# Patient Record
Sex: Male | Born: 1995
Health system: Southern US, Community
[De-identification: ages and names within clinical notes are randomized; demographics above are authoritative.]

## PROBLEM LIST (undated history)

## (undated) DIAGNOSIS — E669 Obesity, unspecified: Secondary | ICD-10-CM

## (undated) DIAGNOSIS — N62 Hypertrophy of breast: Secondary | ICD-10-CM

## (undated) HISTORY — DX: Obesity, unspecified: E66.9

## (undated) HISTORY — DX: Hypertrophy of breast: N62

---
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
## 2015-05-05 IMAGING — US US SCROTUM
1 series · 14 of 25 positions shown · non-contrast
Comparison: None.

CLINICAL DATA: Gynecomastia.  Evaluate for testicular mass.

EXAM:
SCROTAL ULTRASOUND
DOPPLER ULTRASOUND OF THE TESTICLES
TECHNIQUE: Complete ultrasound examination of the testicles, epididymis, and
other scrotal structures was performed. Color and spectral Doppler
ultrasound were also utilized to evaluate blood flow to the
testicles.

[Series 1: us scrotum · 0.05mm/px · 14 of 54 slices shown]
[im 1/54]
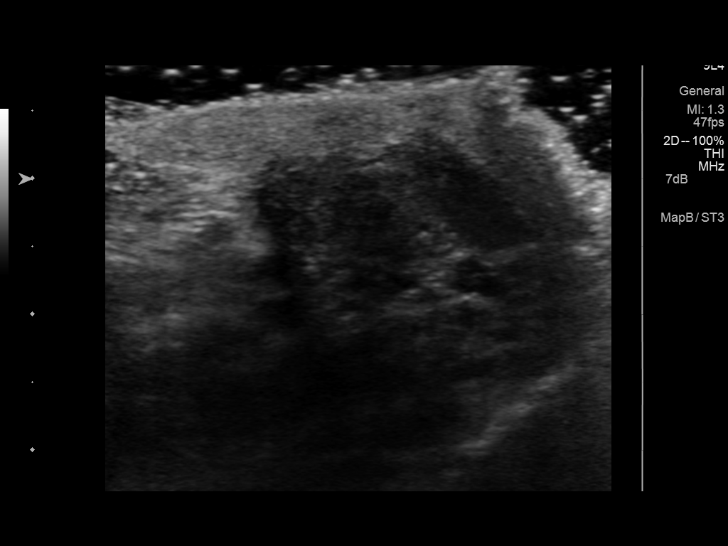
[im 5/54]
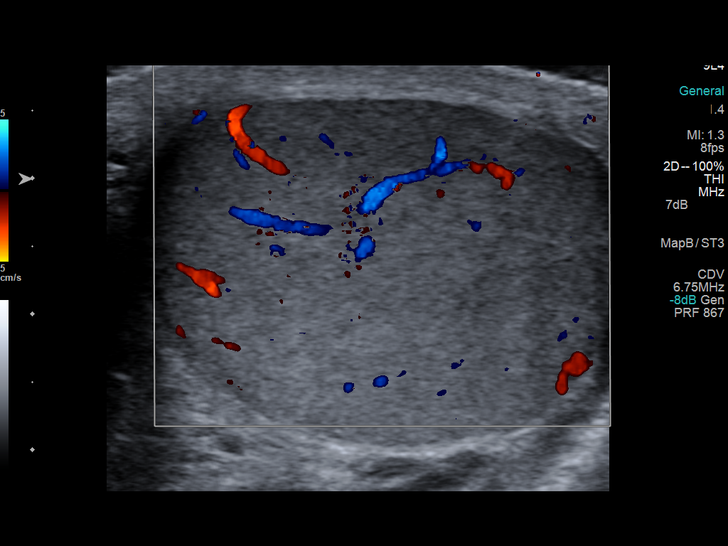
[im 9/54]
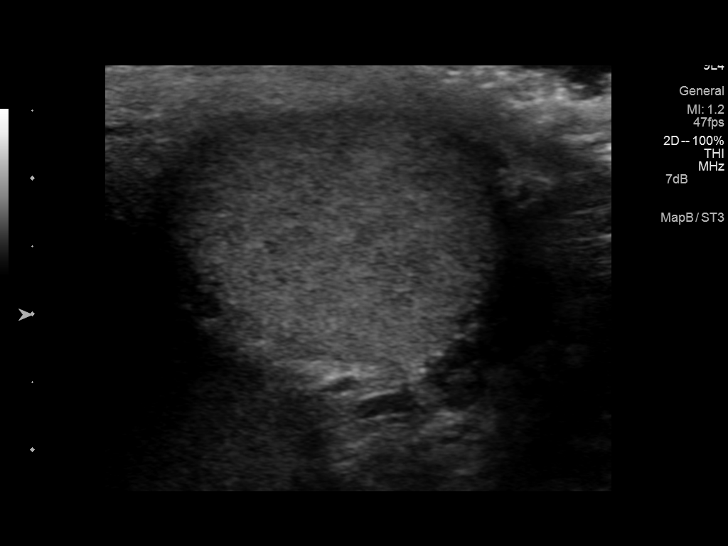
[im 14/54]
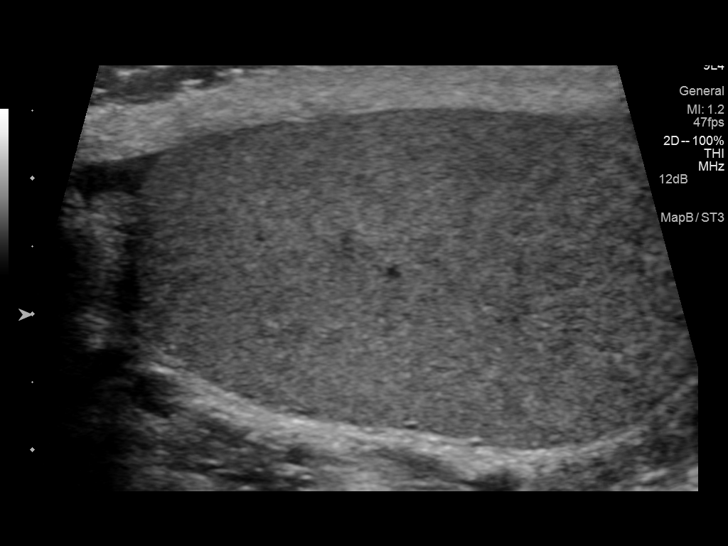
[im 18/54]
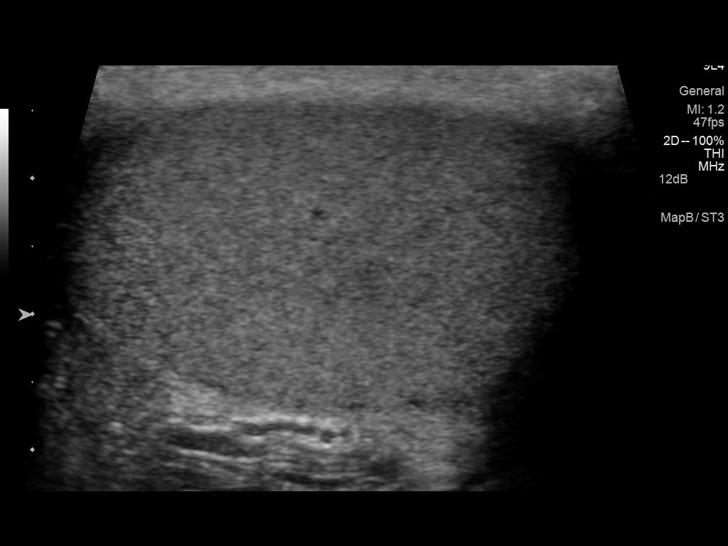
[im 20/54]
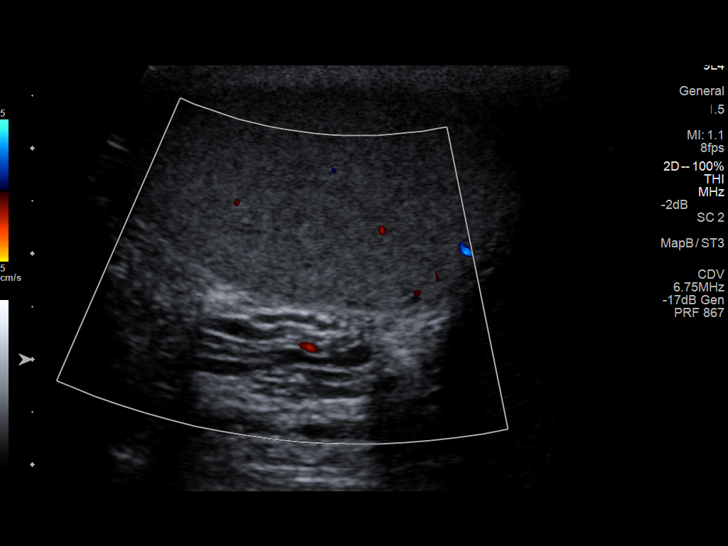
[im 25/54]
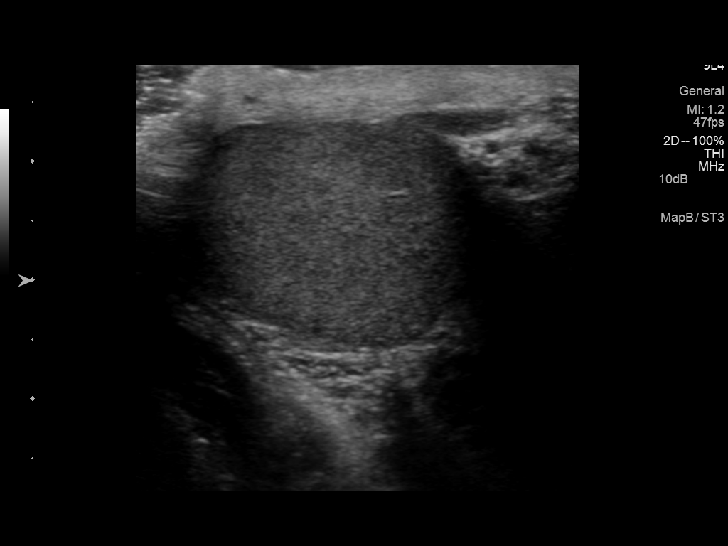
[im 29/54]
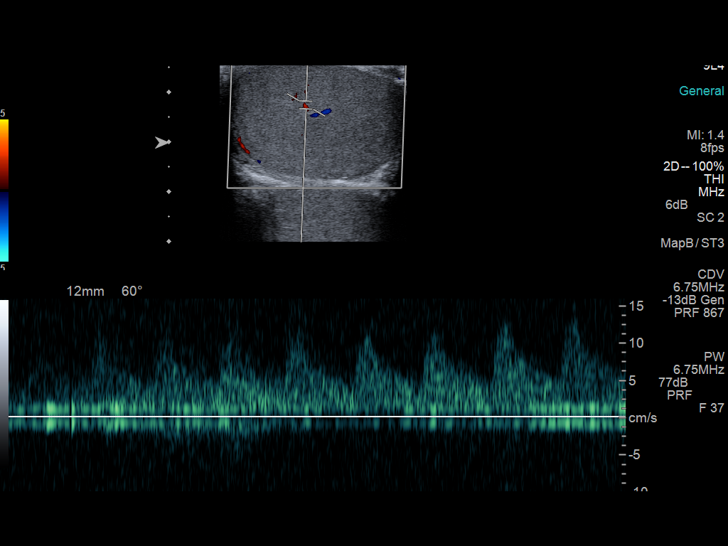
[im 34/54]
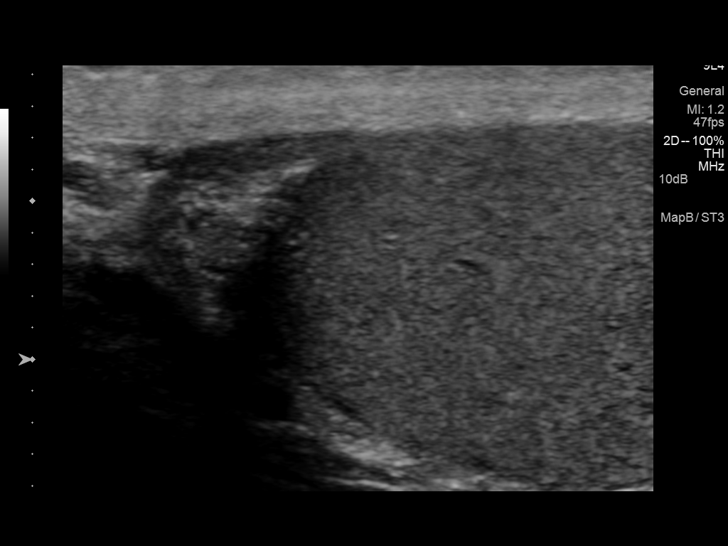
[im 36/54]
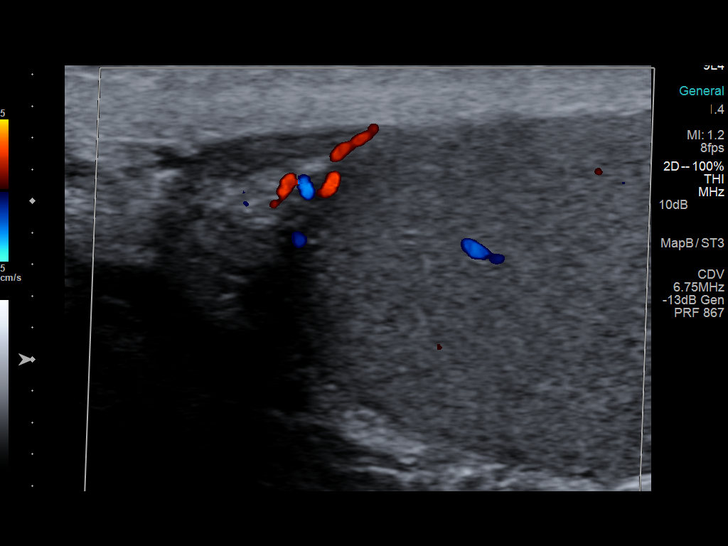
[im 40/54]
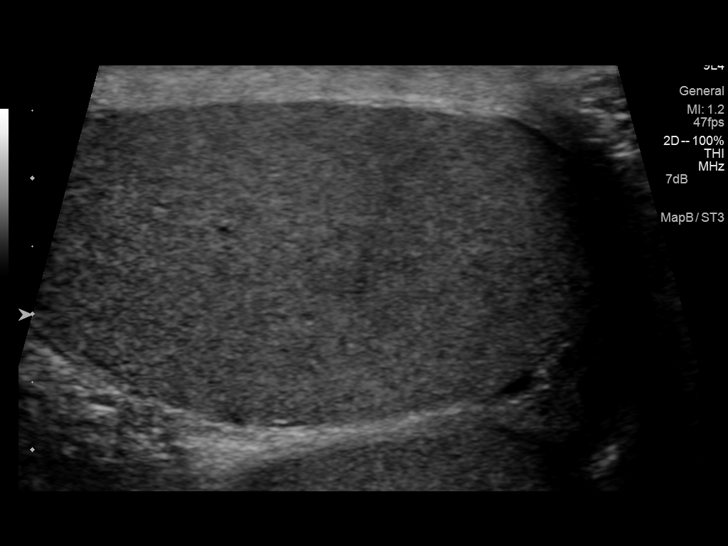
[im 45/54]
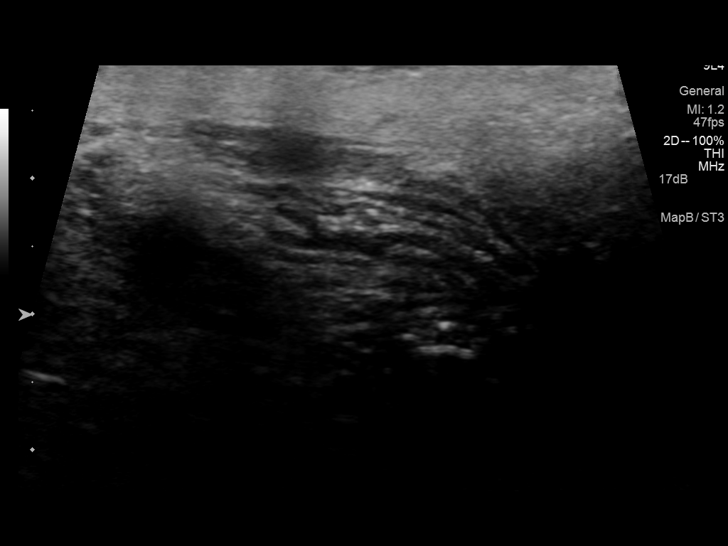
[im 49/54]
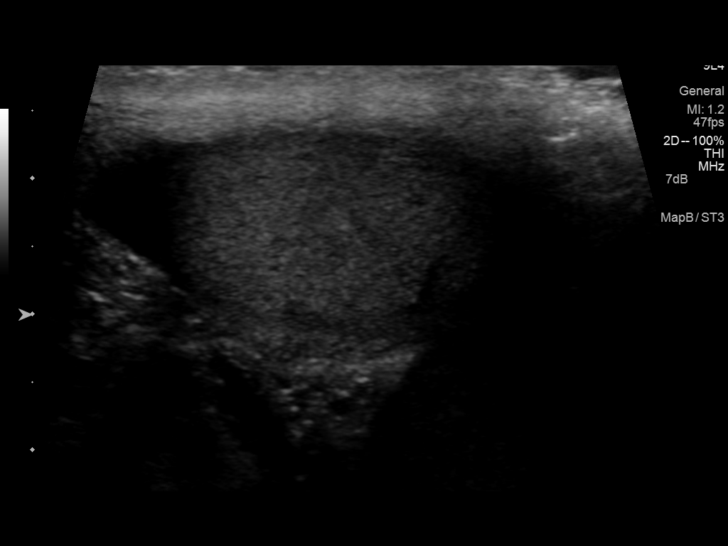
[im 54/54]
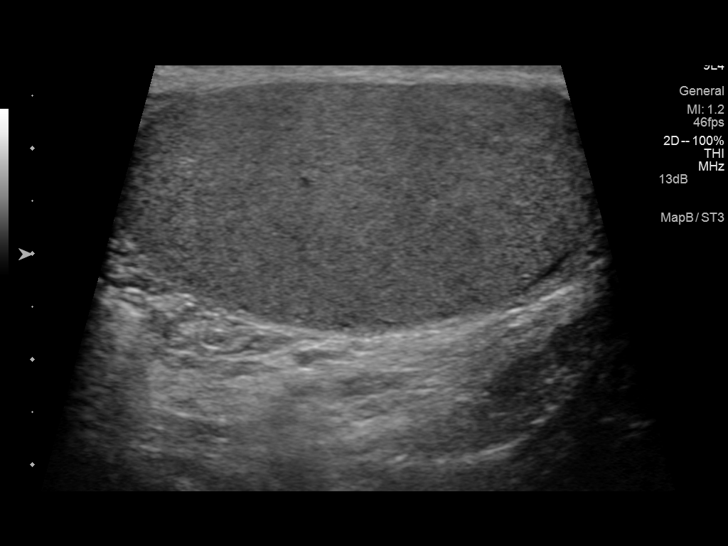

[14 of 25 positions shown; findings below may reference images not displayed]

FINDINGS: Right testicle

Measurements: 4.5 x 2.5 x 3.5 cm. No mass or microlithiasis
visualized.

Left testicle

Measurements: 4.4 x 2.4 x 3.6 cm. No mass or microlithiasis
visualized.

Right epididymis:  Normal in size and appearance.

Left epididymis:  Normal in size and appearance.

Hydrocele:  Small bilateral hydroceles.

Varicocele:  None visualized.

Pulsed Doppler interrogation of both testes demonstrates low
resistance arterial and venous waveforms bilaterally.
IMPRESSION: Small bilateral hydroceles. No focal abnormality otherwise noted.
Specifically there is no evidence of testicular mass.

## 2016-02-19 ENCOUNTER — Encounter: Payer: Self-pay | Admitting: Family Medicine

## 2016-02-19 ENCOUNTER — Ambulatory Visit (INDEPENDENT_AMBULATORY_CARE_PROVIDER_SITE_OTHER): Payer: 59 | Admitting: Family Medicine

## 2016-02-19 VITALS — BP 110/68 | HR 68 | Temp 98.6°F | Resp 14 | Ht 73.0 in | Wt 244.0 lb

## 2016-02-19 DIAGNOSIS — N62 Hypertrophy of breast: Secondary | ICD-10-CM | POA: Diagnosis not present

## 2016-02-19 DIAGNOSIS — Z7689 Persons encountering health services in other specified circumstances: Secondary | ICD-10-CM

## 2016-02-19 DIAGNOSIS — R21 Rash and other nonspecific skin eruption: Secondary | ICD-10-CM | POA: Diagnosis not present

## 2016-02-19 LAB — CBC WITH DIFFERENTIAL/PLATELET
Basophils Absolute: 0 cells/uL (ref 0–200)
Basophils Relative: 0 %
EOS PCT: 2 %
Eosinophils Absolute: 92 cells/uL (ref 15–500)
HCT: 46 % (ref 38.5–50.0)
HEMOGLOBIN: 15.5 g/dL (ref 13.0–17.0)
LYMPHS ABS: 1702 {cells}/uL (ref 850–3900)
Lymphocytes Relative: 37 %
MCH: 26.6 pg — ABNORMAL LOW (ref 27.0–33.0)
MCHC: 33.7 g/dL (ref 32.0–36.0)
MCV: 78.9 fL — ABNORMAL LOW (ref 80.0–100.0)
MPV: 8.8 fL (ref 7.5–12.5)
Monocytes Absolute: 368 cells/uL (ref 200–950)
Monocytes Relative: 8 %
NEUTROS ABS: 2438 {cells}/uL (ref 1500–7800)
NEUTROS PCT: 53 %
Platelets: 300 10*3/uL (ref 140–400)
RBC: 5.83 MIL/uL — AB (ref 4.20–5.80)
RDW: 13.6 % (ref 11.0–15.0)
WBC: 4.6 10*3/uL (ref 3.8–10.8)

## 2016-02-19 LAB — COMPLETE METABOLIC PANEL WITH GFR
ALBUMIN: 5.2 g/dL — AB (ref 3.6–5.1)
ALK PHOS: 41 U/L (ref 40–115)
ALT: 20 U/L (ref 9–46)
AST: 16 U/L (ref 10–40)
BUN: 11 mg/dL (ref 7–25)
CALCIUM: 9.6 mg/dL (ref 8.6–10.3)
CO2: 27 mmol/L (ref 20–31)
Chloride: 101 mmol/L (ref 98–110)
Creat: 0.7 mg/dL (ref 0.60–1.35)
Glucose, Bld: 82 mg/dL (ref 70–99)
POTASSIUM: 3.9 mmol/L (ref 3.5–5.3)
Sodium: 139 mmol/L (ref 135–146)
Total Bilirubin: 2.1 mg/dL — ABNORMAL HIGH (ref 0.2–1.2)
Total Protein: 7.7 g/dL (ref 6.1–8.1)

## 2016-02-19 LAB — LIPID PANEL
CHOLESTEROL: 145 mg/dL (ref <200)
HDL: 51 mg/dL (ref >40)
LDL Cholesterol: 85 mg/dL (ref <100)
TRIGLYCERIDES: 46 mg/dL (ref <150)
Total CHOL/HDL Ratio: 2.8 Ratio (ref <5.0)
VLDL: 9 mg/dL (ref <30)

## 2016-02-19 MED ORDER — CLOTRIMAZOLE-BETAMETHASONE 1-0.05 % EX CREA: 1.0000 "application " | TOPICAL_CREAM | Freq: Two times a day (BID) | CUTANEOUS | 0 refills | Status: DC

## 2016-02-19 NOTE — Progress Notes (Signed)
Subjective:    Patient ID: Jacob Gamble, male    DOB: 09/29/1995, 20 y.o.   MRN: [id number]  HPI Patient is a 20 year old African-American male here today to establish care. He does have one concern. There is a rash on his chest between his breast tissue. The rash is an erythematous plaque. The plaque consists of coalescent erythematous papules with a well-circumscribed border slightly serpiginous in nature. Does not have the classic appearance of tinea. However the patient denies any contact dermatitis or potential causes of contact dermatitis. The rash is very itchy. It is approximately 6 cm x 3 cm. There is no other rash anywhere else on the body. His been present for approximately a week. Past Medical History:  Diagnosis Date  . Obesity    No past surgical history on file. No current outpatient prescriptions on file prior to visit.   No current facility-administered medications on file prior to visit.    No Known Allergies Social History   Social History  . Marital status: Single    Spouse name: N/A  . Number of children: N/A  . Years of education: N/A   Occupational History  . Not on file.   Social History Main Topics  . Smoking status: Passive Smoke Exposure - Never Smoker  . Smokeless tobacco: Never Used  . Alcohol use Yes  . Drug use: No  . Sexual activity: Yes    Birth control/ protection: Condom   Other Topics Concern  . Not on file   Social History Narrative  . No narrative on file   Family History  Problem Relation Age of Onset  . Migraines Mother   . Hypertension Mother   . Migraines Sister   . Hypertension Sister   . Cancer Maternal Uncle     lung     Review of Systems  All other systems reviewed and are negative.      Objective:   Physical Exam  Constitutional: He is oriented to person, place, and time. He appears well-developed and well-nourished. No distress.  HENT:  Head: Normocephalic and atraumatic.  Right Ear: External ear normal.   Left Ear: External ear normal.  Nose: Nose normal.  Mouth/Throat: Oropharynx is clear and moist. No oropharyngeal exudate.  Eyes: Conjunctivae and EOM are normal. Pupils are equal, round, and reactive to light. Right eye exhibits no discharge. Left eye exhibits no discharge. No scleral icterus.  Neck: Normal range of motion. Neck supple. No JVD present. No tracheal deviation present. No thyromegaly present.  Cardiovascular: Normal rate, regular rhythm, normal heart sounds and intact distal pulses.  Exam reveals no gallop and no friction rub.   No murmur heard. Pulmonary/Chest: Effort normal and breath sounds normal. No stridor. No respiratory distress. He has no wheezes. He has no rales. He exhibits no tenderness.  Abdominal: Soft. Bowel sounds are normal. He exhibits no distension and no mass. There is no tenderness. There is no rebound and no guarding.  Musculoskeletal: Normal range of motion. He exhibits no edema, tenderness or deformity.  Lymphadenopathy:    He has no cervical adenopathy.  Neurological: He is alert and oriented to person, place, and time. He has normal reflexes. He displays normal reflexes. No cranial nerve deficit. He exhibits normal muscle tone. Coordination normal.  Skin: Rash noted. He is not diaphoretic.  Psychiatric: He has a normal mood and affect. His behavior is normal. Judgment and thought content normal.  Vitals reviewed.    Obese with gynecomastia  Assessment & Plan:  Encounter to establish care with new doctor - Plan: COMPLETE METABOLIC PANEL WITH GFR, CBC with Differential/Platelet, Lipid panel  Rash and nonspecific skin eruption  Gynecomastia  We had a scheduled regarding therapeutic lifestyle changes to address his obesity. I would like to see the patient closer to 190 pounds. I believe 20 pounds in the next year is a reasonable goal. I recommended avoidance of all soda and juice. I recommended that he drink water. Also recommended a low  carbohydrate diet was less than 1500 kcal per day. Also recommended 30 minutes of exercise 5 days a week. I will treat the rash is a yeast infection with Lotrisone cream twice daily for 1 week. Recheck in one week if no better. I'm treating this based partly on appearance and partly on location. The patient does have gynecomastia and this is almost in the intertriginous area. I'll also check a CBC, CMP, fasting lipid panel. Recommended a flu shot but the patient politely declined

## 2018-10-08 ENCOUNTER — Encounter: Payer: Self-pay | Admitting: Family Medicine

## 2018-10-08 ENCOUNTER — Other Ambulatory Visit: Payer: Self-pay

## 2018-10-08 ENCOUNTER — Ambulatory Visit (INDEPENDENT_AMBULATORY_CARE_PROVIDER_SITE_OTHER): Payer: 59 | Admitting: Family Medicine

## 2018-10-08 VITALS — BP 162/120 | HR 88 | Temp 98.6°F | Resp 16 | Ht 73.0 in | Wt 288.0 lb

## 2018-10-08 DIAGNOSIS — N62 Hypertrophy of breast: Secondary | ICD-10-CM

## 2018-10-08 DIAGNOSIS — Z0001 Encounter for general adult medical examination with abnormal findings: Secondary | ICD-10-CM

## 2018-10-08 DIAGNOSIS — Z0000 Encounter for general adult medical examination without abnormal findings: Secondary | ICD-10-CM

## 2018-10-08 DIAGNOSIS — R030 Elevated blood-pressure reading, without diagnosis of hypertension: Secondary | ICD-10-CM

## 2018-10-08 NOTE — Progress Notes (Signed)
Subjective:    Patient ID: Jacob Gamble, male    DOB: October 29, 1995, 23 y.o.   MRN: [id number]  HPI Patient presents today for a complete physical exam.  His blood pressure on intake was 162/120.  After sitting 10 minutes I rechecked his blood pressure and found it to be 142/100 which is still elevated.  He has not checked it recently.  He denies any chest pain shortness of breath or dyspnea on exertion.  He denies feeling anxious today during our encounter.  He has no medical concerns other than occasional headache.  He reports a headache in his left temple and left occiput.  It occurs once every 3 to 4 weeks.  It is pounding.  It is associated with photophobia.  He has a family history of migraines in his mother.  It will resolve with ibuprofen quickly.  He denies any nausea or vomiting or vision changes.  Otherwise he is doing well with no concerns Past Medical History:  Diagnosis Date  . Gynecomastia   . Obesity    No current outpatient medications on file prior to visit.   No current facility-administered medications on file prior to visit.     No Known Allergies Social History   Socioeconomic History  . Marital status: Single    Spouse name: Not on file  . Number of children: Not on file  . Years of education: Not on file  . Highest education level: Not on file  Occupational History  . Not on file  Social Needs  . Financial resource strain: Not on file  . Food insecurity    Worry: Not on file    Inability: Not on file  . Transportation needs    Medical: Not on file    Non-medical: Not on file  Tobacco Use  . Smoking status: Passive Smoke Exposure - Never Smoker  . Smokeless tobacco: Never Used  Substance and Sexual Activity  . Alcohol use: Yes  . Drug use: No  . Sexual activity: Yes    Birth control/protection: Condom  Lifestyle  . Physical activity    Days per week: Not on file    Minutes per session: Not on file  . Stress: Not on file  Relationships  . Social  Herbalist on phone: Not on file    Gets together: Not on file    Attends religious service: Not on file    Active member of club or organization: Not on file    Attends meetings of clubs or organizations: Not on file    Relationship status: Not on file  . Intimate partner violence    Fear of current or ex partner: Not on file    Emotionally abused: Not on file    Physically abused: Not on file    Forced sexual activity: Not on file  Other Topics Concern  . Not on file  Social History Narrative  . Not on file   Family History  Problem Relation Age of Onset  . Migraines Mother   . Hypertension Mother   . Migraines Sister   . Hypertension Sister   . Cancer Maternal Uncle        lung     Review of Systems  All other systems reviewed and are negative.      Objective:   Physical Exam  Constitutional: He is oriented to person, place, and time. He appears well-developed and well-nourished. No distress.  HENT:  Head: Normocephalic and atraumatic.  Right Ear: External ear normal.  Left Ear: External ear normal.  Nose: Nose normal.  Mouth/Throat: Oropharynx is clear and moist. No oropharyngeal exudate.  Eyes: Pupils are equal, round, and reactive to light. Conjunctivae and EOM are normal. Right eye exhibits no discharge. Left eye exhibits no discharge. No scleral icterus.  Neck: Normal range of motion. Neck supple. No JVD present. No tracheal deviation present. No thyromegaly present.  Cardiovascular: Normal rate, regular rhythm, normal heart sounds and intact distal pulses. Exam reveals no gallop and no friction rub.  No murmur heard. Pulmonary/Chest: Effort normal and breath sounds normal. No stridor. No respiratory distress. He has no wheezes. He has no rales. He exhibits no tenderness.  Abdominal: Soft. Bowel sounds are normal. He exhibits no distension and no mass. There is no abdominal tenderness. There is no rebound and no guarding.  Musculoskeletal: Normal  range of motion.        General: No tenderness, deformity or edema.  Lymphadenopathy:    He has no cervical adenopathy.  Neurological: He is alert and oriented to person, place, and time. He has normal reflexes. No cranial nerve deficit. He exhibits normal muscle tone. Coordination normal.  Skin: He is not diaphoretic.  Psychiatric: He has a normal mood and affect. His behavior is normal. Judgment and thought content normal.  Vitals reviewed.    Obese with gynecomastia     Assessment & Plan:  The primary encounter diagnosis was Elevated blood pressure reading. Diagnoses of General medical exam and Gynecomastia were also pertinent to this visit. I am concerned by his elevated blood pressure.  I will asked the patient check his blood pressure twice a day for the next week and notify me of the values.  If consistently elevated I am concerned about secondary hypertension possibly due to obstructive sleep apnea given his obesity.  I would also check fasting lab work including a CBC, CMP, fasting lipid panel.  Recommended diet exercise and weight loss.  I do think patients develop migraines and is having occasional migraines that respond quickly to ibuprofen.  As long as he is only get the headaches once every month and they respond quickly to ibuprofen I would not focus on preventative strategies.  Await the results of the lab work and follow-up on blood pressures at the end of the week as we discussed.

## 2018-10-09 LAB — CBC WITH DIFFERENTIAL/PLATELET
Absolute Monocytes: 456 cells/uL (ref 200–950)
Basophils Absolute: 32 cells/uL (ref 0–200)
Basophils Relative: 0.6 %
Eosinophils Absolute: 159 cells/uL (ref 15–500)
Eosinophils Relative: 3 %
HCT: 46.2 % (ref 38.5–50.0)
Hemoglobin: 15.1 g/dL (ref 13.2–17.1)
Lymphs Abs: 2120 cells/uL (ref 850–3900)
MCH: 26.2 pg — ABNORMAL LOW (ref 27.0–33.0)
MCHC: 32.7 g/dL (ref 32.0–36.0)
MCV: 80.1 fL (ref 80.0–100.0)
MPV: 9.7 fL (ref 7.5–12.5)
Monocytes Relative: 8.6 %
Neutro Abs: 2533 cells/uL (ref 1500–7800)
Neutrophils Relative %: 47.8 %
Platelets: 354 10*3/uL (ref 140–400)
RBC: 5.77 10*6/uL (ref 4.20–5.80)
RDW: 12.9 % (ref 11.0–15.0)
Total Lymphocyte: 40 %
WBC: 5.3 10*3/uL (ref 3.8–10.8)

## 2018-10-09 LAB — COMPLETE METABOLIC PANEL WITH GFR
AG Ratio: 1.7 (calc) (ref 1.0–2.5)
ALT: 45 U/L (ref 9–46)
AST: 24 U/L (ref 10–40)
Albumin: 4.6 g/dL (ref 3.6–5.1)
Alkaline phosphatase (APISO): 49 U/L (ref 36–130)
BUN: 11 mg/dL (ref 7–25)
CO2: 25 mmol/L (ref 20–32)
Calcium: 9.5 mg/dL (ref 8.6–10.3)
Chloride: 104 mmol/L (ref 98–110)
Creat: 0.82 mg/dL (ref 0.60–1.35)
GFR, Est African American: 143 mL/min/{1.73_m2} (ref >=60)
GFR, Est Non African American: 124 mL/min/{1.73_m2} (ref >=60)
Globulin: 2.7 g/dL (calc) (ref 1.9–3.7)
Glucose, Bld: 95 mg/dL (ref 65–99)
Potassium: 4.1 mmol/L (ref 3.5–5.3)
Sodium: 139 mmol/L (ref 135–146)
Total Bilirubin: 1.3 mg/dL — ABNORMAL HIGH (ref 0.2–1.2)
Total Protein: 7.3 g/dL (ref 6.1–8.1)

## 2018-10-09 LAB — LIPID PANEL
Cholesterol: 159 mg/dL (ref <200)
HDL: 46 mg/dL (ref >=40)
LDL Cholesterol (Calc): 99 mg/dL (calc)
Non-HDL Cholesterol (Calc): 113 mg/dL (calc) (ref <130)
Total CHOL/HDL Ratio: 3.5 (calc) (ref <5.0)
Triglycerides: 48 mg/dL (ref <150)

## 2018-11-20 ENCOUNTER — Telehealth: Payer: Self-pay | Admitting: Family Medicine

## 2018-11-20 NOTE — Telephone Encounter (Signed)
FYI Patient left voicemail  to notify us that he has tested positive for COVID and is currently isolated. Left voicemail for patient to inquire if he is having any symptoms

## 2018-11-27 NOTE — Telephone Encounter (Signed)
Pt left vm to let us know that he did test positive for COVID 19

## 2018-11-28 ENCOUNTER — Other Ambulatory Visit: Payer: Self-pay

## 2018-11-28 DIAGNOSIS — U071 COVID-19: Secondary | ICD-10-CM

## 2018-11-29 LAB — NOVEL CORONAVIRUS, NAA: SARS-CoV-2, NAA: NOT DETECTED

## 2018-11-30 ENCOUNTER — Telehealth: Payer: Self-pay | Admitting: Hematology

## 2018-11-30 NOTE — Telephone Encounter (Signed)
Pt is aware covid 19 test is negative °

## 2019-04-25 ENCOUNTER — Ambulatory Visit: Payer: Self-pay | Attending: Family

## 2019-04-25 DIAGNOSIS — Z23 Encounter for immunization: Secondary | ICD-10-CM | POA: Insufficient documentation

## 2019-04-25 NOTE — Progress Notes (Signed)
   Covid-19 Vaccination Clinic  Name:  Jacob Gamble    MRN: [id number] DOB: [birth year]  04/25/2019  [name] was observed post Covid-19 immunization for 15 minutes without incidence. He was provided with Vaccine Information Sheet and instruction to access the V-Safe system.   [name] was instructed to call 911 with any severe reactions post vaccine: Marland Kitchen Difficulty breathing  . Swelling of your face and throat  . A fast heartbeat  . A bad rash all over your body  . Dizziness and weakness    Immunizations Administered    Name Date Dose VIS Date Route   Moderna COVID-19 Vaccine 04/25/2019 10:45 AM 0.5 mL 02/12/2019 Intramuscular   Manufacturer: Moderna   Lot: [id number]   NDC: 40684-033-53

## 2019-05-28 ENCOUNTER — Ambulatory Visit: Payer: Self-pay | Attending: Internal Medicine

## 2020-01-21 ENCOUNTER — Ambulatory Visit: Payer: Self-pay | Admitting: Family Medicine

## 2020-01-21 ENCOUNTER — Other Ambulatory Visit: Payer: Self-pay

## 2020-01-21 VITALS — BP 130/96 | HR 75 | Temp 98.2°F | Ht 71.0 in | Wt 292.0 lb

## 2020-01-21 DIAGNOSIS — N62 Hypertrophy of breast: Secondary | ICD-10-CM

## 2020-01-21 DIAGNOSIS — R030 Elevated blood-pressure reading, without diagnosis of hypertension: Secondary | ICD-10-CM

## 2020-01-21 DIAGNOSIS — B360 Pityriasis versicolor: Secondary | ICD-10-CM

## 2020-01-21 DIAGNOSIS — Z0000 Encounter for general adult medical examination without abnormal findings: Secondary | ICD-10-CM

## 2020-01-21 DIAGNOSIS — Z0001 Encounter for general adult medical examination with abnormal findings: Secondary | ICD-10-CM

## 2020-01-21 LAB — CBC WITH DIFFERENTIAL/PLATELET: Basophils Relative: 0.4 %

## 2020-01-21 MED ORDER — KETOCONAZOLE 2 % EX SHAM: 1.0000 "application " | MEDICATED_SHAMPOO | CUTANEOUS | 11 refills | Status: AC

## 2020-01-21 NOTE — Progress Notes (Signed)
Subjective:    Patient ID: Jacob Gamble, male    DOB: May 06, 1995, 24 y.o.   MRN: [id number]  HPI Patient is a very pleasant 24 year old male here today for complete physical exam.  Last year at his physical exam his blood pressure was high.  He is again high this year.  He denies any chest pain shortness of breath or dyspnea on exertion.  He admits to eating a high salt-containing diet and also a diet high in fast food, pork, and red meat.  He is not getting regular exercise.  His BMI is elevated at 40.  Otherwise he is doing well.  He declines his flu shot today.  He has had 1 out of 2 Covid vaccines. Past Medical History:  Diagnosis Date  . Gynecomastia   . Obesity    No current outpatient medications on file prior to visit.   No current facility-administered medications on file prior to visit.    No Known Allergies Social History   Socioeconomic History  . Marital status: Single    Spouse name: Not on file  . Number of children: Not on file  . Years of education: Not on file  . Highest education level: Not on file  Occupational History  . Not on file  Tobacco Use  . Smoking status: Passive Smoke Exposure - Never Smoker  . Smokeless tobacco: Never Used  Substance and Sexual Activity  . Alcohol use: Yes  . Drug use: No  . Sexual activity: Yes    Birth control/protection: Condom  Other Topics Concern  . Not on file  Social History Narrative  . Not on file   Social Determinants of Health   Financial Resource Strain:   . Difficulty of Paying Living Expenses: Not on file  Food Insecurity:   . Worried About Programme researcher, broadcasting/film/video in the Last Year: Not on file  . Ran Out of Food in the Last Year: Not on file  Transportation Needs:   . Lack of Transportation (Medical): Not on file  . Lack of Transportation (Non-Medical): Not on file  Physical Activity:   . Days of Exercise per Week: Not on file  . Minutes of Exercise per Session: Not on file  Stress:   . Feeling of  Stress : Not on file  Social Connections:   . Frequency of Communication with Friends and Family: Not on file  . Frequency of Social Gatherings with Friends and Family: Not on file  . Attends Religious Services: Not on file  . Active Member of Clubs or Organizations: Not on file  . Attends Banker Meetings: Not on file  . Marital Status: Not on file  Intimate Partner Violence:   . Fear of Current or Ex-Partner: Not on file  . Emotionally Abused: Not on file  . Physically Abused: Not on file  . Sexually Abused: Not on file   Family History  Problem Relation Age of Onset  . Migraines Mother   . Hypertension Mother   . Migraines Sister   . Hypertension Sister   . Cancer Maternal Uncle        lung     Review of Systems  All other systems reviewed and are negative.      Objective:   Physical Exam Vitals reviewed.  Constitutional:      General: He is not in acute distress.    Appearance: He is well-developed. He is not diaphoretic.  HENT:     Head: Normocephalic  and atraumatic.     Right Ear: External ear normal.     Left Ear: External ear normal.     Nose: Nose normal.     Mouth/Throat:     Pharynx: No oropharyngeal exudate.  Eyes:     General: No scleral icterus.       Right eye: No discharge.        Left eye: No discharge.     Conjunctiva/sclera: Conjunctivae normal.     Pupils: Pupils are equal, round, and reactive to light.  Neck:     Thyroid: No thyromegaly.     Vascular: No JVD.     Trachea: No tracheal deviation.  Cardiovascular:     Rate and Rhythm: Normal rate and regular rhythm.     Heart sounds: Normal heart sounds. No murmur heard.  No friction rub. No gallop.   Pulmonary:     Effort: Pulmonary effort is normal. No respiratory distress.     Breath sounds: Normal breath sounds. No stridor. No wheezing or rales.  Chest:     Chest wall: No tenderness.  Abdominal:     General: Bowel sounds are normal. There is no distension.      Palpations: Abdomen is soft. There is no mass.     Tenderness: There is no abdominal tenderness. There is no guarding or rebound.  Musculoskeletal:        General: No tenderness or deformity. Normal range of motion.     Cervical back: Normal range of motion and neck supple.  Lymphadenopathy:     Cervical: No cervical adenopathy.  Skin:    Findings: Rash present. Rash is macular.       Neurological:     Mental Status: He is alert and oriented to person, place, and time.     Cranial Nerves: No cranial nerve deficit.     Motor: No abnormal muscle tone.     Coordination: Coordination normal.     Deep Tendon Reflexes: Reflexes are normal and symmetric.  Psychiatric:        Behavior: Behavior normal.        Thought Content: Thought content normal.        Judgment: Judgment normal.      Obese with gynecomastia     Assessment & Plan:  Elevated blood pressure reading - Plan: CBC with Differential/Platelet, COMPLETE METABOLIC PANEL WITH GFR, Lipid panel  General medical exam  Gynecomastia  Tinea versicolor   I am again concerned by his blood pressure.  I explained to the patient that elevated blood pressure over time can cause kidney problems.  It can also cause heart issues.  Patient would like to check his blood pressure at home over the next few days and then email me the values.  If greater than 140/90, I would recommend starting him on an angiotensin receptor blocker.  Also recommended 30 minutes of aerobic exercise 5 days a week and aggressive weight loss.  We also discussed a low saturated fat low sodium diet.  I will check, a CBC, CMP, and a fasting lipid panel.  Offered the patient a flu shot but he declined.  I recommend he get his second Covid vaccine.  He has tinea versicolor on his chest abdomen and back.  We will treat this with ketoconazole 2% shampoo applied 2-3 times a week and rinse off after 10 minutes.

## 2020-01-22 LAB — CBC WITH DIFFERENTIAL/PLATELET
Absolute Monocytes: 442 cells/uL (ref 200–950)
Basophils Absolute: 21 cells/uL (ref 0–200)
Eosinophils Absolute: 120 cells/uL (ref 15–500)
Eosinophils Relative: 2.3 %
HCT: 45.6 % (ref 38.5–50.0)
Hemoglobin: 14.8 g/dL (ref 13.2–17.1)
Lymphs Abs: 1758 cells/uL (ref 850–3900)
MCH: 26.4 pg — ABNORMAL LOW (ref 27.0–33.0)
MCHC: 32.5 g/dL (ref 32.0–36.0)
MCV: 81.3 fL (ref 80.0–100.0)
MPV: 9.2 fL (ref 7.5–12.5)
Monocytes Relative: 8.5 %
Neutro Abs: 2860 cells/uL (ref 1500–7800)
Neutrophils Relative %: 55 %
Platelets: 350 10*3/uL (ref 140–400)
RBC: 5.61 10*6/uL (ref 4.20–5.80)
RDW: 12.5 % (ref 11.0–15.0)
Total Lymphocyte: 33.8 %
WBC: 5.2 10*3/uL (ref 3.8–10.8)

## 2020-01-22 LAB — COMPLETE METABOLIC PANEL WITH GFR
AG Ratio: 1.8 (calc) (ref 1.0–2.5)
ALT: 38 U/L (ref 9–46)
AST: 21 U/L (ref 10–40)
Albumin: 4.6 g/dL (ref 3.6–5.1)
Alkaline phosphatase (APISO): 56 U/L (ref 36–130)
BUN: 13 mg/dL (ref 7–25)
CO2: 26 mmol/L (ref 20–32)
Calcium: 9.6 mg/dL (ref 8.6–10.3)
Chloride: 104 mmol/L (ref 98–110)
Creat: 0.76 mg/dL (ref 0.60–1.35)
GFR, Est African American: 147 mL/min/{1.73_m2} (ref >=60)
GFR, Est Non African American: 127 mL/min/{1.73_m2} (ref >=60)
Globulin: 2.6 g/dL (calc) (ref 1.9–3.7)
Glucose, Bld: 99 mg/dL (ref 65–99)
Potassium: 4 mmol/L (ref 3.5–5.3)
Sodium: 139 mmol/L (ref 135–146)
Total Bilirubin: 1.3 mg/dL — ABNORMAL HIGH (ref 0.2–1.2)
Total Protein: 7.2 g/dL (ref 6.1–8.1)

## 2020-01-22 LAB — LIPID PANEL
Cholesterol: 163 mg/dL (ref <200)
HDL: 51 mg/dL (ref >=40)
LDL Cholesterol (Calc): 95 mg/dL (calc)
Non-HDL Cholesterol (Calc): 112 mg/dL (calc) (ref <130)
Total CHOL/HDL Ratio: 3.2 (calc) (ref <5.0)
Triglycerides: 84 mg/dL (ref <150)

## 2021-05-29 ENCOUNTER — Ambulatory Visit
Admission: EM | Admit: 2021-05-29 | Discharge: 2021-05-29 | Disposition: A | Discharge status: Home / Self Care | Payer: Self-pay | Attending: Physician Assistant | Admitting: Physician Assistant

## 2021-05-29 ENCOUNTER — Other Ambulatory Visit: Payer: Self-pay

## 2021-05-29 DIAGNOSIS — Z23 Encounter for immunization: Secondary | ICD-10-CM

## 2021-05-29 DIAGNOSIS — S61210A Laceration without foreign body of right index finger without damage to nail, initial encounter: Secondary | ICD-10-CM

## 2021-05-29 MED ORDER — TETANUS-DIPHTH-ACELL PERTUSSIS 5-2.5-18.5 LF-MCG/0.5 IM SUSY
0.5000 mL | PREFILLED_SYRINGE | Freq: Once | INTRAMUSCULAR | Status: AC
  Administered 2021-05-29: 0.5 mL via INTRAMUSCULAR

## 2021-05-29 NOTE — ED Provider Notes (Signed)
?[facility] ? ? ? ?CSN: [id number] ?Arrival date & time: 05/29/21  1037 ? ? ?  ? ?History   ?Chief Complaint ?Chief Complaint  ?Patient presents with  ? Laceration  ? ? ?HPI ?Jacob Gamble is a 26 y.o. male.  ? ?Patient here today for evaluation of laceration to his right index finger that occurred about an hour or so ago.  He reports that he was helping to move furniture in a nail from a chair cut his finger.  He does not have any active bleeding at this time.  He is unsure when his last tetanus vaccine was. ? ?The history is provided by the patient.  ?Laceration ?Associated symptoms: no fever   ? ?Past Medical History:  ?Diagnosis Date  ? Gynecomastia   ? Obesity   ? ? ?Patient Active Problem List  ? Diagnosis Date Noted  ? Gynecomastia   ? Gynecomastia, male 06/04/2013  ? Tinea versicolor 06/04/2013  ? Well child check 06/04/2013  ? Frequent headaches 06/04/2013  ? ANKLE SPRAIN 11/11/2009  ? ? ?History reviewed. No pertinent surgical history. ? ? ? ? ?Home Medications   ? ?Prior to Admission medications   ?Medication Sig Start Date End Date Taking? Authorizing Provider  ?ketoconazole (NIZORAL) 2 % shampoo Apply 1 application topically 2 (two) times a week. 01/23/20   Donita Brooks, MD  ? ? ?Family History ?Family History  ?Problem Relation Age of Onset  ? Migraines Mother   ? Hypertension Mother   ? Migraines Sister   ? Hypertension Sister   ? Cancer Maternal Uncle   ?     lung  ? ? ?Social History ?Social History  ? ?Tobacco Use  ? Smoking status: Passive Smoke Exposure - Never Smoker  ? Smokeless tobacco: Never  ?Substance Use Topics  ? Alcohol use: Yes  ? Drug use: No  ? ? ? ?Allergies   ?Patient has no known allergies. ? ? ?Review of Systems ?Review of Systems  ?Constitutional:  Negative for chills and fever.  ?Eyes:  Negative for discharge and redness.  ?Skin:  Positive for wound. Negative for color change.  ?Neurological:  Negative for numbness.  ? ? ?Physical Exam ?Triage Vital Signs ?ED  Triage Vitals  ?Enc Vitals Group  ?   BP 05/29/21 1138 138/89  ?   Pulse Rate 05/29/21 1138 74  ?   Resp 05/29/21 1138 18  ?   Temp 05/29/21 1138 98.4 ?F (36.9 ?C)  ?   Temp Source 05/29/21 1138 Oral  ?   SpO2 05/29/21 1138 98 %  ?   Weight --   ?   Height --   ?   Head Circumference --   ?   Peak Flow --   ?   Pain Score 05/29/21 1136 0  ?   Pain Loc --   ?   Pain Edu? --   ?   Excl. in GC? --   ? ?No data found. ? ?Updated Vital Signs ?BP 138/89 (BP Location: Left Arm)   Pulse 74   Temp 98.4 ?F (36.9 ?C) (Oral)   Resp 18   SpO2 98%  ?   ? ?Physical Exam ?Vitals and nursing note reviewed.  ?Constitutional:   ?   General: He is not in acute distress. ?   Appearance: Normal appearance. He is not ill-appearing.  ?HENT:  ?   Head: Normocephalic and atraumatic.  ?Eyes:  ?   Conjunctiva/sclera: Conjunctivae normal.  ?Cardiovascular:  ?  Rate and Rhythm: Normal rate.  ?Pulmonary:  ?   Effort: Pulmonary effort is normal.  ?Skin: ?   Comments: Approx 1 inch relatively superficial laceration to palmar surface of right index finger between MCP and PIP, no active bleeding or drainage, wound is clean, no FB appreciated  ?Neurological:  ?   Mental Status: He is alert.  ?Psychiatric:     ?   Mood and Affect: Mood normal.     ?   Behavior: Behavior normal.     ?   Thought Content: Thought content normal.  ? ? ? ?UC Treatments / Results  ?Labs ?(all labs ordered are listed, but only abnormal results are displayed) ?Labs Reviewed - No data to display ? ?EKG ? ? ?Radiology ?No results found. ? ?Procedures ?Procedures (including critical care time) ? ?Medications Ordered in UC ?Medications  ?Tdap (BOOSTRIX) injection 0.5 mL (has no administration in time range)  ? ? ?Initial Impression / Assessment and Plan / UC Course  ?I have reviewed the triage vital signs and the nursing notes. ? ?Pertinent labs & imaging results that were available during my care of the patient were reviewed by me and considered in my medical decision making  (see chart for details). ? ?  ?Dermabond used to seal wound, Tdap administered in office as last documented vaccine was 2008. Recommend monitoring for signs of infection and follow up with any further concerns.  ? ?Final Clinical Impressions(s) / UC Diagnoses  ? ?Final diagnoses:  ?Laceration of index finger of right hand without complication  ? ?Discharge Instructions   ?None ?  ? ?ED Prescriptions   ?None ?  ? ?PDMP not reviewed this encounter. ?  ?Tomi Bamberger, PA-C ?05/29/21 1151 ? ?

## 2021-05-29 NOTE — ED Triage Notes (Signed)
Pt states today while moving furniture a nail cut his right pointer finger (laceration). Laceration is about 1 inch long, edges approximated, no active bleeding at this time. Patient states he does not know the last time he had a Tdap vaccine. ?

## 2022-09-23 ENCOUNTER — Encounter: Payer: Self-pay | Admitting: *Deleted

## 2022-09-23 ENCOUNTER — Ambulatory Visit
Admission: EM | Admit: 2022-09-23 | Discharge: 2022-09-23 | Disposition: A | Discharge status: Home / Self Care | Payer: Self-pay | Attending: Internal Medicine | Admitting: Internal Medicine

## 2022-09-23 ENCOUNTER — Other Ambulatory Visit: Payer: Self-pay

## 2022-09-23 DIAGNOSIS — U071 COVID-19: Secondary | ICD-10-CM | POA: Insufficient documentation

## 2022-09-23 NOTE — Discharge Instructions (Signed)
Your COVID test is pending.  Will call if it is positive.

## 2022-09-23 NOTE — ED Triage Notes (Addendum)
Pt reports testing positive for Covid 3 days ago after not feeling well. States today "saw a faint line" on Covid test; states he "just wanted to make sure". Pt requesting return to work note. States has a minor sore throat, but otherwise feels "fine".

## 2022-09-23 NOTE — ED Provider Notes (Signed)
[facility]    CSN: [id number] Arrival date & time: 09/23/22  4098      History   Chief Complaint No chief complaint on file.   HPI DIMETRIUS LAZZARA is a 27 y.o. male.   Patient presents today for covid testing. Reports that he had a faint positive covid test at home about 4 days ago.  States he has had nasal congestion, runny nose, cough, sore throat.  All symptoms have resolved except for minimal sore throat.  Denies any fever or known sick contacts.  Has taken DayQuil for symptoms.  Reports that he needs a work note to return to work.     Past Medical History:  Diagnosis Date   Gynecomastia    Obesity     Patient Active Problem List   Diagnosis Date Noted   Gynecomastia    Gynecomastia, male 06/04/2013   Tinea versicolor 06/04/2013   Well child check 06/04/2013   Frequent headaches 06/04/2013   ANKLE SPRAIN 11/11/2009    History reviewed. No pertinent surgical history.     Home Medications    Prior to Admission medications   Medication Sig Start Date End Date Taking? Authorizing Provider  ketoconazole (NIZORAL) 2 % shampoo Apply 1 application topically 2 (two) times a week. 01/23/20   Donita Brooks, MD    Family History Family History  Problem Relation Age of Onset   Migraines Mother    Hypertension Mother    Migraines Sister    Hypertension Sister    Cancer Maternal Uncle        lung    Social History Social History   Tobacco Use   Smoking status: Passive Smoke Exposure - Never Smoker   Smokeless tobacco: Never  Substance Use Topics   Alcohol use: Yes    Comment: occasionally   Drug use: No     Allergies   Patient has no known allergies.   Review of Systems Review of Systems Per HPI  Physical Exam Triage Vital Signs ED Triage Vitals [09/23/22 0941]  Encounter Vitals Group     BP 129/83     Systolic BP Percentile      Diastolic BP Percentile      Pulse Rate 77     Resp 16     Temp 98 F (36.7 C)     Temp  Source Oral     SpO2 97 %     Weight      Height      Head Circumference      Peak Flow      Pain Score 3     Pain Loc      Pain Education      Exclude from Growth Chart    No data found.  Updated Vital Signs BP 129/83   Pulse 77   Temp 98 F (36.7 C) (Oral)   Resp 16   SpO2 97%   Visual Acuity Right Eye Distance:   Left Eye Distance:   Bilateral Distance:    Right Eye Near:   Left Eye Near:    Bilateral Near:     Physical Exam Constitutional:      General: He is not in acute distress.    Appearance: Normal appearance. He is not toxic-appearing or diaphoretic.  HENT:     Head: Normocephalic and atraumatic.     Right Ear: Tympanic membrane and ear canal normal.     Left Ear: Tympanic membrane and ear canal normal.  Nose: Nose normal. No congestion.     Mouth/Throat:     Mouth: Mucous membranes are moist.     Pharynx: Posterior oropharyngeal erythema present.  Eyes:     Extraocular Movements: Extraocular movements intact.     Conjunctiva/sclera: Conjunctivae normal.     Pupils: Pupils are equal, round, and reactive to light.  Cardiovascular:     Rate and Rhythm: Normal rate and regular rhythm.     Pulses: Normal pulses.     Heart sounds: Normal heart sounds.  Pulmonary:     Effort: Pulmonary effort is normal. No respiratory distress.     Breath sounds: Normal breath sounds. No stridor. No wheezing, rhonchi or rales.  Abdominal:     General: Abdomen is flat. Bowel sounds are normal.     Palpations: Abdomen is soft.  Musculoskeletal:        General: Normal range of motion.     Cervical back: Normal range of motion.  Skin:    General: Skin is warm and dry.  Neurological:     General: No focal deficit present.     Mental Status: He is alert and oriented to person, place, and time. Mental status is at baseline.  Psychiatric:        Mood and Affect: Mood normal.        Behavior: Behavior normal.      UC Treatments / Results  Labs (all labs ordered  are listed, but only abnormal results are displayed) Labs Reviewed  SARS CORONAVIRUS 2 (TAT 6-24 HRS)    EKG   Radiology No results found.  Procedures Procedures (including critical care time)  Medications Ordered in UC Medications - No data to display  Initial Impression / Assessment and Plan / UC Course  I have reviewed the triage vital signs and the nursing notes.  Pertinent labs & imaging results that were available during my care of the patient were reviewed by me and considered in my medical decision making (see chart for details).     Patient tested positive with COVID test at home and is requesting repeat COVID testing today for work.  COVID PCR is pending.  Patient reports symptoms have resolved except for sore throat.  Encouraged adequate fluid hydration, rest, supportive care, symptom management at home.  Advised follow-up if any symptoms persist or worsen.  Discussed COVID precautions with patient.  Patient verbalized understanding and was agreeable with plan. Final Clinical Impressions(s) / UC Diagnoses   Final diagnoses:  COVID-19     Discharge Instructions      Your COVID test is pending.  Will call if it is positive.    ED Prescriptions   None    PDMP not reviewed this encounter.   Gustavus Bryant, [place] 09/23/22 1014

## 2022-09-24 LAB — SARS CORONAVIRUS 2 (TAT 6-24 HRS): SARS Coronavirus 2: NEGATIVE

## 2023-03-06 ENCOUNTER — Encounter: Payer: Self-pay | Admitting: *Deleted

## 2023-03-06 ENCOUNTER — Ambulatory Visit
Admission: EM | Admit: 2023-03-06 | Discharge: 2023-03-06 | Disposition: A | Discharge status: Home / Self Care | Payer: Self-pay | Attending: Family Medicine | Admitting: Family Medicine

## 2023-03-06 ENCOUNTER — Other Ambulatory Visit: Payer: Self-pay

## 2023-03-06 DIAGNOSIS — H579 Unspecified disorder of eye and adnexa: Secondary | ICD-10-CM

## 2023-03-06 MED ORDER — GENTAMICIN SULFATE 0.3 % OP SOLN: 2.0000 [drp] | Freq: Three times a day (TID) | OPHTHALMIC | 0 refills | Status: AC

## 2023-03-06 NOTE — Discharge Instructions (Signed)
Put gentamicin eyedrops in the affected eye(s) 3 times daily for 5 days.  Try claritin or allegra for the itchy eyes.

## 2023-03-06 NOTE — ED Provider Notes (Signed)
[facility]    CSN: [id number] Arrival date & time: 03/06/23  8295      History   Chief Complaint Chief Complaint  Patient presents with   Itchy Eye    HPI Jacob Gamble is a 27 y.o. male.   HPI Here for itchy eyes bilaterally. No redness or dc.  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   No cough/congestion/fever.  NKDA Past Medical History:  Diagnosis Date   Gynecomastia    Obesity     Patient Active Problem List   Diagnosis Date Noted   Gynecomastia    Gynecomastia, male 06/04/2013   Tinea versicolor 06/04/2013   Well child check 06/04/2013   Frequent headaches 06/04/2013   Sprain of ankle 11/11/2009    History reviewed. No pertinent surgical history.     Home Medications    Prior to Admission medications   Medication Sig Start Date End Date Taking? Authorizing Provider  gentamicin (GARAMYCIN) 0.3 % ophthalmic solution Place 2 drops into both eyes 3 (three) times daily for 5 days. 03/06/23 03/11/23 Yes Jakie Debow, Janace Aris, MD  ketoconazole (NIZORAL) 2 % shampoo Apply 1 application topically 2 (two) times a week. 01/23/20   Donita Brooks, MD    Family History Family History  Problem Relation Age of Onset   Migraines Mother    Hypertension Mother    Migraines Sister    Hypertension Sister    Cancer Maternal Uncle        lung    Social History Social History   Tobacco Use   Smoking status: Passive Smoke Exposure - Never Smoker   Smokeless tobacco: Never  Substance Use Topics   Alcohol use: Not Currently    Comment: occasionally   Drug use: No     Allergies   Patient has no known allergies.   Review of Systems Review of Systems   Physical Exam Triage Vital Signs ED Triage Vitals  Encounter Vitals Group     BP 03/06/23 0900 (!) 135/91     Systolic BP Percentile --      Diastolic BP Percentile --      Pulse Rate 03/06/23 0900 79     Resp 03/06/23 0900 16     Temp 03/06/23 0900 98.1 F  (36.7 C)     Temp Source 03/06/23 0900 Oral     SpO2 03/06/23 0900 97 %     Weight --      Height --      Head Circumference --      Peak Flow --      Pain Score 03/06/23 0857 0     Pain Loc --      Pain Education --      Exclude from Growth Chart --    No data found.  Updated Vital Signs BP (!) 135/91 (BP Location: Left Arm)   Pulse 79   Temp 98.1 F (36.7 C) (Oral)   Resp 16   SpO2 97%   Visual Acuity Right Eye Distance:   Left Eye Distance:   Bilateral Distance:    Right Eye Near:   Left Eye Near:    Bilateral Near:     Physical Exam Vitals reviewed.  Constitutional:      General: He is not in acute distress.    Appearance: He is not ill-appearing, toxic-appearing or diaphoretic.  HENT:     Nose: Nose normal.     Mouth/Throat:     Mouth:  Mucous membranes are moist.  Eyes:     General:        Right eye: No discharge.        Left eye: No discharge.     Extraocular Movements: Extraocular movements intact.     Conjunctiva/sclera: Conjunctivae normal.     Pupils: Pupils are equal, round, and reactive to light.     Comments: The lids are not swollen or erythematous. No injection and no dc seen.   Neurological:     Mental Status: He is alert.      UC Treatments / Results  Labs (all labs ordered are listed, but only abnormal results are displayed) Labs Reviewed - No data to display  EKG   Radiology No results found.  Procedures Procedures (including critical care time)  Medications Ordered in UC Medications - No data to display  Initial Impression / Assessment and Plan / UC Course  I have reviewed the triage vital signs and the nursing notes.  Pertinent labs & imaging results that were available during my care of the patient were reviewed by me and considered in my medical decision making (see chart for details).     I have sent in gent eye drops in case this develops into conjunctivitis, with the holiday coming.   Allegra or claritin  recommended for poss allergies causing the itching instead. Final Clinical Impressions(s) / UC Diagnoses   Final diagnoses:  Itchy eyes     Discharge Instructions      Put gentamicin eyedrops in the affected eye(s) 3 times daily for 5 days.  Try claritin or allegra for the itchy eyes.      ED Prescriptions     Medication Sig Dispense Auth. Provider   gentamicin (GARAMYCIN) 0.3 % ophthalmic solution Place 2 drops into both eyes 3 (three) times daily for 5 days. 5 mL Zenia Resides, MD      PDMP not reviewed this encounter.   Zenia Resides, MD 03/06/23 [phone]

## 2023-03-06 NOTE — ED Triage Notes (Addendum)
Pt worked with a co-worker yesterday who had pink eye. This morning he has had some itching in both eyes. He wears glasses but does not have them with him
